# Patient Record
Sex: Female | Born: 2005 | Race: White | Hispanic: No | Marital: Single | Smoking: Never smoker
Health system: Southern US, Community
[De-identification: ages and names within clinical notes are randomized; demographics above are authoritative.]

## PROBLEM LIST (undated history)

## (undated) DIAGNOSIS — K200 Eosinophilic esophagitis: Secondary | ICD-10-CM

## (undated) DIAGNOSIS — K219 Gastro-esophageal reflux disease without esophagitis: Secondary | ICD-10-CM

## (undated) HISTORY — PX: TYMPANOSTOMY: SHX2586

## (undated) HISTORY — PX: HIATAL HERNIA REPAIR: SHX195

## (undated) HISTORY — DX: Gastro-esophageal reflux disease without esophagitis: K21.9

## (undated) HISTORY — PX: APPENDECTOMY: SHX54

## (undated) HISTORY — DX: Eosinophilic esophagitis: K20.0

---
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
## 2023-01-19 ENCOUNTER — Encounter: Payer: Self-pay | Admitting: Gastroenterology

## 2023-01-19 ENCOUNTER — Ambulatory Visit: Payer: Managed Care, Other (non HMO) | Admitting: Gastroenterology

## 2023-01-19 VITALS — BP 116/68 | HR 80 | Ht 65.0 in | Wt 115.2 lb

## 2023-01-19 DIAGNOSIS — K200 Eosinophilic esophagitis: Secondary | ICD-10-CM

## 2023-01-19 DIAGNOSIS — R1310 Dysphagia, unspecified: Secondary | ICD-10-CM

## 2023-01-19 DIAGNOSIS — R1319 Other dysphagia: Secondary | ICD-10-CM

## 2023-01-19 NOTE — Patient Instructions (Addendum)
   You have been scheduled for an endoscopy. Please follow written instructions given to you at your visit today.  If you use inhalers (even only as needed), please bring them with you on the day of your procedure.  If you take any of the following medications, they will need to be adjusted prior to your procedure:   DO NOT TAKE 7 DAYS PRIOR TO TEST- Trulicity (dulaglutide) Ozempic, Wegovy (semaglutide) Mounjaro (tirzepatide) Bydureon Bcise (exanatide extended release)  DO NOT TAKE 1 DAY PRIOR TO YOUR TEST Rybelsus (semaglutide) Adlyxin (lixisenatide) Victoza (liraglutide) Byetta (exanatide) ___________________________________________________________________________    _______________________________________________________  If your blood pressure at your visit was 140/90 or greater, please contact your primary care physician to follow up on this.  _______________________________________________________  If you are age 7 or older, your body mass index should be between 23-30. Your Body mass index is 19.17 kg/m. If this is out of the aforementioned range listed, please consider follow up with your Primary Care Provider.  If you are age 87 or younger, your body mass index should be between 19-25. Your Body mass index is 19.17 kg/m. If this is out of the aformentioned range listed, please consider follow up with your Primary Care Provider.   ________________________________________________________  The [facility] providers would like to encourage you to use [facility] to communicate with providers for non-urgent requests or questions.  Due to long hold times on the telephone, sending your provider a message by [facility] may be a faster and more efficient way to get a response.  Please allow 48 business hours for a response.  Please remember that this is for non-urgent requests.  _______________________________________________________ It was a pleasure to see you today!  Thank you for  trusting me with your gastrointestinal care!

## 2023-01-19 NOTE — Progress Notes (Signed)
[facility] Progress Note  Chief Complaint: Eosinophilic esophagitis with dysphagia  Subjective  Prior history  Caitlyn Wells is on break from college and following up today for eosinophilic esophagitis diagnosed at EGD 09/14/2022 and confirmed on biopsies.  She has been on budesonide slurry 2 mg nightly since early August, and was doing well at a video visit 11/03/2022.   Discussed the use of AI scribe software for clinical note transcription with the patient, who gave verbal consent to proceed.  History of Present Illness   The patient, with a history of eosinophilic esophagitis, reports a sensation of tightness in the throat, particularly when consuming certain foods. The specific triggers remain unidentified, as the patient has been unable to discern a pattern. Despite these symptoms, the patient's weight has remained stable over the past two months.  The patient has been inconsistently using budesonide liquid, averaging once a week, despite initial daily use for approximately one and a half months. The patient noticed the return of symptoms after this period of regular use. No new symptoms such as swelling, redness in the eyes or joints, or rashes have been reported.   ROS: Cardiovascular:  no chest pain Respiratory: no dyspnea  The patient's Past Medical, Family and Social History were reviewed and are on file in the EMR. Past Medical History:  Diagnosis Date   Eosinophilic esophagitis    GERD (gastroesophageal reflux disease)     Past Surgical History:  Procedure Laterality Date   APPENDECTOMY     HIATAL HERNIA REPAIR     KNEE ARTHROCENTESIS  08/2021   TYMPANOSTOMY       Objective:  Med list reviewed  Current Outpatient Medications:    budesonide (PULMICORT) 0.5 MG/2ML nebulizer solution, Take 8 mLs (2 mg total) by nebulization at bedtime., Disp: 240 mL, Rfl: 1   fluticasone (FLONASE) 50 MCG/ACT nasal spray, Place into the nose., Disp: , Rfl:    Vital signs in last  24 hrs: Vitals:   01/19/23 0907  BP: 116/68  Pulse: 80   Weight stable at 115 pounds  Physical Exam  Well-appearing, petite and thin Cardiac: Regular without appreciable murmur,  no peripheral edema Pulm: clear to auscultation bilaterally, normal RR and effort noted Abdomen: soft, no tenderness, with active bowel sounds. No guarding or palpable hepatosplenomegaly. Skin; warm and dry, no jaundice or rash   Labs:   ___________________________________________ Radiologic studies:   ____________________________________________ Other: 09/14/2022 upper endoscopic biopsies  1. Surgical [P], duodenal bx BENIGN DUODENAL MUCOSA WITH NO DIAGNOSTIC ABNORMALITY 2. Surgical [P], distal esophagus EOSINOPHIL RICH ESOPHAGITIS CONSISTENT WITH EOSINOPHILIC ESOPHAGITIS (>50 EOS/HIGH POWER FIELD) NEGATIVE FOR GLANDULAR EPITHELIUM, DYSPLASIA AND CARCINOMA 3. Surgical [P], mid esophagus EOSINOPHIL-RICH ESOPHAGITIS CONSISTENT WITH EOSINOPHILIC ESOPHAGITIS (> 30 EOS/HIGH POWER FIELD) NEGATIVE FOR GLANDULAR EPITHELIUM, DYSPLASIA AND CARCINOMA _____________________________________________   Encounter Diagnoses  Name Primary?   Eosinophilic esophagitis Yes   Esophageal dysphagia    Persistent dysphagia, not surprising given the degree of initial stricture and modest dilation that was initially performed.  She needs repeat evaluation for further esophageal stricture dilation and biopsies to assess response to treatment.  Challenging is always in this condition to determine specific food triggers since they are not IgE mediated, therefore not allowing IgE antibody based food testing. Additional clinical information regarding this condition as potential triggers were sent to the patient in prior portal messages. Assessment and Plan    Eosinophilic Esophagitis Persistent symptoms of dysphagia and sensation of tightness in the throat. Inconsistent use of Budesonide  liquid (once weekly). Stable  weight. No new symptoms of swelling, redness, or rashes. Discussed the dual nature of the condition involving mechanical narrowing and motility issues due to chronic inflammation and fibrosis. Also discussed potential future treatment with Dupixent if insufficient improvement with Budesonide. -Encouraged consistent use of Budesonide liquid daily until next endoscopy. -Plan for repeat endoscopy with dilation and biopsies to assess the degree of improvement with Budesonide treatment.    20 minutes were spent on this encounter (including chart review, history/exam, counseling/coordination of care, and documentation) > 50% of that time was spent on counseling and coordination of care.   Charlie Pitter III

## 2023-03-01 ENCOUNTER — Ambulatory Visit: Payer: Managed Care, Other (non HMO) | Admitting: Gastroenterology

## 2023-03-01 ENCOUNTER — Encounter: Payer: Self-pay | Admitting: Gastroenterology

## 2023-03-01 VITALS — BP 110/79 | HR 93 | Temp 98.4°F | Resp 12 | Ht 65.0 in | Wt 115.0 lb

## 2023-03-01 DIAGNOSIS — K200 Eosinophilic esophagitis: Secondary | ICD-10-CM

## 2023-03-01 DIAGNOSIS — R1310 Dysphagia, unspecified: Secondary | ICD-10-CM | POA: Diagnosis not present

## 2023-03-01 DIAGNOSIS — R1319 Other dysphagia: Secondary | ICD-10-CM

## 2023-03-01 DIAGNOSIS — K222 Esophageal obstruction: Secondary | ICD-10-CM | POA: Diagnosis present

## 2023-03-01 DIAGNOSIS — R110 Nausea: Secondary | ICD-10-CM

## 2023-03-01 MED ORDER — SODIUM CHLORIDE 0.9 % IV SOLN: 500.0000 mL | Freq: Once | INTRAVENOUS | Status: AC

## 2023-03-01 MED ORDER — SODIUM CHLORIDE 0.9 % IV SOLN
4.0000 mg | Freq: Once | INTRAVENOUS | Status: AC
  Administered 2023-03-01: 4 mg via INTRAVENOUS

## 2023-03-01 NOTE — Progress Notes (Signed)
 History and Physical:  This patient presents for endoscopic testing for: Encounter Diagnoses  Name Primary?   Eosinophilic esophagitis Yes   Esophageal dysphagia    Esophageal stricture     EGD today for evaluation of EoE and dilation of esophageal stricture for dysphagia.   Clinical details in 01/19/23 office note and no significant changes reported since then.  Patient is otherwise without complaints or active issues today.   Past Medical History: Past Medical History:  Diagnosis Date   Eosinophilic esophagitis    GERD (gastroesophageal reflux disease)      Past Surgical History: Past Surgical History:  Procedure Laterality Date   APPENDECTOMY     HIATAL HERNIA REPAIR     KNEE ARTHROCENTESIS  08/2021   TYMPANOSTOMY      Allergies: Not on File  Outpatient Meds: Current Outpatient Medications  Medication Sig Dispense Refill   fluticasone (FLONASE) 50 MCG/ACT nasal spray Place into the nose.     budesonide  (PULMICORT ) 0.5 MG/2ML nebulizer solution Take 8 mLs (2 mg total) by nebulization at bedtime. 240 mL 1   HYDROcodone-acetaminophen (NORCO) 7.5-325 MG tablet TAKE 1 TABLET BY MOUTH EVERY 6 TO 8 HOURS AS NEEDED     Current Facility-Administered Medications  Medication Dose Route Frequency Provider Last Rate Last Admin   0.9 %  sodium chloride  infusion  500 mL Intravenous Once Danis, Victory LITTIE MOULD, MD          ___________________________________________________________________ Objective   Exam:  BP 117/75   Pulse (!) 107   Temp 98.4 F (36.9 C) (Temporal)   Ht 5' 5 (1.651 m)   Wt 115 lb (52.2 kg)   SpO2 100%   BMI 19.14 kg/m   CV: regular , S1/S2 Resp: clear to auscultation bilaterally, normal RR and effort noted GI: soft, no tenderness, with active bowel sounds.   Assessment: Encounter Diagnoses  Name Primary?   Eosinophilic esophagitis Yes   Esophageal dysphagia    Esophageal stricture      Plan: EGD with biopsies and dilation.  The  benefits and risks of the planned procedure were described in detail with the patient or (when appropriate) their health care proxy.  Risks were outlined as including, but not limited to, bleeding, infection, perforation, adverse medication reaction leading to cardiac or pulmonary decompensation, pancreatitis (if ERCP).  The limitation of incomplete mucosal visualization was also discussed.  No guarantees or warranties were given.  The patient is appropriate for an endoscopic procedure in the ambulatory setting.   - Victory Brand, MD

## 2023-03-01 NOTE — Progress Notes (Signed)
To pacu, vss. Report to [email or web address] 

## 2023-03-01 NOTE — Op Note (Signed)
 [facility] Patient Name: Caitlyn Wells Procedure Date: 03/01/2023 9:00 AM MRN: [id number] Endoscopist: Victory L. Legrand , MD, [id number] Age: 18 Referring MD:  Date of Birth: 2005/07/27 Gender: Female Account #: [id number] Procedure:                Upper GI endoscopy Indications:              Esophageal dysphagia, For therapy of esophageal                            stricture, Follow-up of eosinophilic esophagitis                           patient taking oral budesonide  (inconsistently)                           clinical details in 01/19/23 office note Medicines:                Monitored Anesthesia Care Procedure:                Pre-Anesthesia Assessment:                           - Prior to the procedure, a History and Physical                            was performed, and patient medications and                            allergies were reviewed. The patient's tolerance of                            previous anesthesia was also reviewed. The risks                            and benefits of the procedure and the sedation                            options and risks were discussed with the patient.                            All questions were answered, and informed consent                            was obtained. Prior Anticoagulants: The patient has                            taken no anticoagulant or antiplatelet agents. ASA                            Grade Assessment: II - A patient with mild systemic                            disease. After reviewing the risks and benefits,  the patient was deemed in satisfactory condition to                            undergo the procedure.                           After obtaining informed consent, the endoscope was                            passed under direct vision. Throughout the                            procedure, the patient's blood pressure, pulse, and                            oxygen saturations  were monitored continuously. The                            Olympus Scope [id number] was introduced through the                            mouth, and advanced to the second part of duodenum.                            The upper GI endoscopy was accomplished without                            difficulty. The patient tolerated the procedure                            well. Scope In: Scope Out: Findings:                 The larynx was normal.                           Mucosal changes including longitudinal furrows,                            small-caliber esophagus, white plaques, congestion                            (edema) and punctate white spots were found at the                            esophageal anastomosis. Esophageal findings were                            graded using the Eosinophilic Esophagitis                            Endoscopic Reference Score (EoE-EREFS) as: Edema                            Grade 1 Present (decreased clarity or absence of  vascular markings), Rings Grade 1 Mild (subtle                            circumferential ridges seen on esophageal                            distension), Exudates Grade 2 Severe (scattered                            white lesions involving 10 percent or greater of                            the esophageal surface area), Furrows Grade 1 Mild                            (vertical lines without visible depth) and                            Stricture present. Several biopsies were obtained                            in the upper third of the esophagus and in the                            lower third of the esophagus with cold forceps for                            evaluation of eosinophilic esophagitis. (positive                            tug sign) Mucosal appearance is not improved                            since last EGD July 2024                           One benign-appearing, intrinsic moderate stenosis                             was found at the gastroesophageal junction. This                            stenosis measured 8 mm (inner diameter) x less than                            one cm (in length). The stenosis was traversed. A                            TTS dilator was passed through the scope. Dilation                            with an 01-05-12 mm balloon dilator was performed  to 13 mm. The dilation site was examined and showed                            moderate mucosal disruption and moderate                            improvement in luminal narrowing.                           The stomach was normal.                           The cardia and gastric fundus were normal on                            retroflexion.                           The examined duodenum was normal. Complications:            No immediate complications. Estimated Blood Loss:     Estimated blood loss was minimal. Impression:               - Normal larynx.                           - Esophageal mucosal changes secondary to                            eosinophilic esophagitis.                           - Benign-appearing esophageal stenosis. Dilated.                           - Normal stomach.                           - Normal examined duodenum.                           - Several biopsies were obtained in the upper third                            of the esophagus and in the lower third of the                            esophagus. Recommendation:           - Patient has a contact number available for                            emergencies. The signs and symptoms of potential                            delayed complications were discussed with the  patient. Return to normal activities tomorrow.                            Written discharge instructions were provided to the                            patient.                           - Resume previous diet.                            - Continue present medications.                           - Await pathology results (evaluate eosinophil                            count c/w prior exam and r/o candida). Then discuss                            starting Dupixent .                           - Return to my office at appointment to be                            scheduled. Marina Desire L. Legrand, MD 03/01/2023 10:01:08 AM This report has been signed electronically.

## 2023-03-01 NOTE — Patient Instructions (Signed)
 Return to normal activities tomorrow. Written discharge instructions were provided to the patient. - Resume previous diet. - Continue present medications. - Await pathology results ( evaluate eosinophil count c/ w prior exam and r/ o candida) . Then discuss starting Dupixent . - Return to my office at appointment to be scheduled.  YOU HAD AN ENDOSCOPIC PROCEDURE TODAY AT THE [facility]:   Refer to the procedure report that was given to you for any specific questions about what was found during the examination.  If the procedure report does not answer your questions, please call your gastroenterologist to clarify.  If you requested that your care partner not be given the details of your procedure findings, then the procedure report has been included in a sealed envelope for you to review at your convenience later.  YOU SHOULD EXPECT: Some feelings of bloating in the abdomen. Passage of more gas than usual.  Walking can help get rid of the air that was put into your GI tract during the procedure and reduce the bloating. If you had a lower endoscopy (such as a colonoscopy or flexible sigmoidoscopy) you may notice spotting of blood in your stool or on the toilet paper. If you underwent a bowel prep for your procedure, you may not have a normal bowel movement for a few days.  Please Note:  You might notice some irritation and congestion in your nose or some drainage.  This is from the oxygen used during your procedure.  There is no need for concern and it should clear up in a day or so.  SYMPTOMS TO REPORT IMMEDIATELY:  Following lower endoscopy (colonoscopy or flexible sigmoidoscopy):  Excessive amounts of blood in the stool  Significant tenderness or worsening of abdominal pains  Swelling of the abdomen that is new, acute  Fever of 100F or higher  Following upper endoscopy (EGD)  Vomiting of blood or coffee ground material  New chest pain or pain under the shoulder blades  Painful or  persistently difficult swallowing  New shortness of breath  Fever of 100F or higher  Black, tarry-looking stools  For urgent or emergent issues, a gastroenterologist can be reached at any hour by calling [phone]. Do not use MyChart messaging for urgent concerns.    DIET:  We do recommend a small meal at first, but then you may proceed to your regular diet.  Drink plenty of fluids but you should avoid alcoholic beverages for 24 hours.  ACTIVITY:  You should plan to take it easy for the rest of today and you should NOT DRIVE or use heavy machinery until tomorrow (because of the sedation medicines used during the test).    FOLLOW UP: Our staff will call the number listed on your records the next business day following your procedure.  We will call around 7:15- 8:00 am to check on you and address any questions or concerns that you may have regarding the information given to you following your procedure. If we do not reach you, we will leave a message.     If any biopsies were taken you will be contacted by phone or by letter within the next 1-3 weeks.  Please call us  at [phone] if you have not heard about the biopsies in 3 weeks.    SIGNATURES/CONFIDENTIALITY: You and/or your care partner have signed paperwork which will be entered into your electronic medical record.  These signatures attest to the fact that that the information above on your After Visit Summary  has been reviewed and is understood.  Full responsibility of the confidentiality of this discharge information lies with you and/or your care-partner.

## 2023-03-01 NOTE — Progress Notes (Signed)
 Called to room to assist during endoscopic procedure.  Patient ID and intended procedure confirmed with present staff. Received instructions for my participation in the procedure from the performing physician.

## 2023-03-02 ENCOUNTER — Telehealth: Payer: Self-pay

## 2023-03-02 NOTE — Telephone Encounter (Signed)
 Left message on follow up call.

## 2023-03-03 LAB — SURGICAL PATHOLOGY

## 2023-03-09 ENCOUNTER — Encounter: Payer: Self-pay | Admitting: Gastroenterology

## 2023-03-09 DIAGNOSIS — K222 Esophageal obstruction: Secondary | ICD-10-CM

## 2023-03-09 DIAGNOSIS — K200 Eosinophilic esophagitis: Secondary | ICD-10-CM

## 2023-03-09 DIAGNOSIS — R1319 Other dysphagia: Secondary | ICD-10-CM

## 2023-03-10 NOTE — Telephone Encounter (Signed)
 Telemedicine visits are difficult to work into my in-person clinic blocks. I could do it at 4pm on Jan 23rd. (If I am running late in clinic, we will call her)  - H. Danis

## 2023-03-18 ENCOUNTER — Telehealth (INDEPENDENT_AMBULATORY_CARE_PROVIDER_SITE_OTHER): Payer: Managed Care, Other (non HMO) | Admitting: Gastroenterology

## 2023-03-18 DIAGNOSIS — K222 Esophageal obstruction: Secondary | ICD-10-CM

## 2023-03-18 DIAGNOSIS — R1319 Other dysphagia: Secondary | ICD-10-CM | POA: Diagnosis not present

## 2023-03-18 DIAGNOSIS — K200 Eosinophilic esophagitis: Secondary | ICD-10-CM | POA: Diagnosis not present

## 2023-03-18 NOTE — Telephone Encounter (Signed)
Dr. Myrtie Neither, please advise on script for Dupixent. Thanks

## 2023-03-18 NOTE — Progress Notes (Signed)
Telemedicine Visit:  Participants on the conference : myself and patient   The patient consented to this consultation and was aware that a charge will be placed through their insurance.  They were also made aware of the limitations of telemedicine.  I was in my office and the patient was at home.   Encounter time:  Total time 20 minutes, with 15 minutes spent with patient on [facility] (audio and video)   _____________________________________________________________________________________________            [facility] GI Progress Note  Chief Complaint: Dysphagia and eosinophilic esophagitis  Subjective  History: EOE and stricture history outlined in prior notes. Was taking budesonide slurry regularly after first EGD, then inconsistently last couple of months.  EGD 03/01/2023 with similar appearance to July 2024.  EG junction stricture not quite as tight, and it could be passed with moderate scope pressure.  Dilated up to 13 mm, biopsies taken (results still showed significant eosinophilic infiltrate)  Caitlyn Wells reports that her swallowing is somewhat better after the recent dilation, that she needs to be careful eating as always. She is familiar with the Dupixent since a sibling of hers takes it for eczema, and she is aware that it is a weekly injection.  She does express some concerns about its potential cost and is planning to have a further discussion with her parents later this evening about whether they may want to move forward with insurance authorization and cost analysis.  She is generally been able to maintain her weight albeit with a modified consistency diet  ROS: Cardiovascular:  no chest pain Respiratory: no dyspnea  The patient's Past Medical, Family and Social History were reviewed and are on file in the EMR. Past Medical History:  Diagnosis Date   Eosinophilic esophagitis    GERD (gastroesophageal reflux disease)     Past Surgical History:  Procedure Laterality  Date   APPENDECTOMY     HIATAL HERNIA REPAIR     KNEE ARTHROCENTESIS  08/2021   TYMPANOSTOMY      Objective:  Med list reviewed  Current Outpatient Medications:    budesonide (PULMICORT) 0.5 MG/2ML nebulizer solution, Take 8 mLs (2 mg total) by nebulization at bedtime., Disp: 240 mL, Rfl: 1   fluticasone (FLONASE) 50 MCG/ACT nasal spray, Place into the nose., Disp: , Rfl:    HYDROcodone-acetaminophen (NORCO) 7.5-325 MG tablet, TAKE 1 TABLET BY MOUTH EVERY 6 TO 8 HOURS AS NEEDED, Disp: , Rfl:   Current Facility-Administered Medications:    0.9 %  sodium chloride infusion, 500 mL, Intravenous, Once, Caitlyn Wells, Caitlyn Blower, MD      Physical Exam  No exam-telemedicine visit  Biopsy results: FINAL DIAGNOSIS       1. Surgical [P], distal esophagus :      EOSINOPHIL RICH ESOPHAGITIS       2. Surgical [P], proximal esophagus :      EOSINOPHIL RICH ESOPHAGITIS       ELECTRONIC SIGNATURE : Caitlyn Wells , Sports administrator, International aid/development worker  MICROSCOPIC DESCRIPTION 1. -2.These esophageal biopsies show squamous mucosa with marked reactive changes including elongation of papillae and marked basal zone hyperplasia with a marked increased and mixed mononuclear cell infiltrate, predominantly eosinophils.These eosinophils exhibit significant degranulation and a superficial margination with scattered eosinophilic micro abscesses.These eosinophils number greater than 30 within a high-power field.No glandular epithelium is present.These histologic changes are consistent with an eosinophil rich esophagitis the differential diagnosis of which would include true eosinophilic esophagitis (favored) and severe reflux esophagitis.Clinical and  endoscopic correlation is recommended.    _____________________________________________ Assessment & Plan  Assessment: Encounter Diagnoses  Name Primary?   Eosinophilic esophagitis Yes   Esophageal dysphagia    Esophageal stricture    EOE,  little or no improvement on budesonide slurry We discussed the progressive nature of this condition if we cannot control the eosinophilic inflammation.  That means it would likely become increasingly difficult for endoscopic dilation to give lasting result because of the probable development of fibrosis within the esophageal wall and resultant dysmotility.  She has tried her best to avoid some of the most common suspected dietary triggers for this condition.  I recommended that they consider starting Dupixent.  We discussed the nature that medicine, potential risks of parasitic infection, respiratory infections, joint pain, neurologic symptoms, local injection site reaction.   Plan: She will have a further discussion with her parents and let us know soon about how they would like to proceed.  If they wish to move forward with insurance authorization and cost analysis, the dose would be  Dupixent 300 mg subcutaneous injection once a week   Caitlyn Wells

## 2023-03-22 ENCOUNTER — Telehealth: Payer: Self-pay

## 2023-03-22 MED ORDER — DUPILUMAB 300 MG/2ML ~~LOC~~ SOSY: 300.0000 mg | PREFILLED_SYRINGE | SUBCUTANEOUS | 5 refills | Status: DC

## 2023-03-22 NOTE — Telephone Encounter (Signed)
RX sent to pharmacy on file. See 1/27 telephone encounter for details regarding PA for Dupixent.

## 2023-03-22 NOTE — Telephone Encounter (Signed)
Please initiate PA for Dupixent 300 mg subcutaneous injection weekly. RX has been sent to local pharmacy. Thanks

## 2023-03-22 NOTE — Telephone Encounter (Signed)
Thank you for the update, and please see my January 23 telemedicine note when you can.  Dupixent 300 mg subcutaneous once a week  Ellwood Dense MD

## 2023-03-24 ENCOUNTER — Telehealth: Payer: Self-pay

## 2023-03-24 ENCOUNTER — Other Ambulatory Visit (HOSPITAL_COMMUNITY): Payer: Self-pay

## 2023-03-24 NOTE — Telephone Encounter (Signed)
Pharmacy Patient Advocate Encounter   Received notification from CoverMyMeds that prior authorization for Dupixent 300 mg/2 ml syringes is required/requested.   Insurance verification completed.   The patient is insured through [employer] .   Per test claim: PA required; PA submitted to above mentioned insurance via CoverMyMeds Key/confirmation #/EOC [id number] Status is pending

## 2023-03-24 NOTE — Telephone Encounter (Signed)
Pharmacy Patient Advocate Encounter  Received notification from [employer] that Prior Authorization for DUPIXENT 300MG  has been DENIED.  Full denial letter will be uploaded to the media tab. See denial reason below.   PA #/Case ID/Reference #: [id number]  Pt does not have secondary cause for EOE. Will submit appeal.

## 2023-03-24 NOTE — Telephone Encounter (Signed)
PA request has been Submitted. New Encounter created for follow up. For additional info see Pharmacy Prior Auth telephone encounter from 03/24/2023.

## 2023-03-25 ENCOUNTER — Telehealth: Payer: Self-pay | Admitting: Pharmacist

## 2023-03-25 NOTE — Telephone Encounter (Signed)
See additional prior authorization phone note dated 03/24/23.

## 2023-03-25 NOTE — Telephone Encounter (Signed)
An expedited appeal has been submitted for Dupixent. Will advise when response is received. Appeal letter and supporting documentation have been faxed to [phone] on 03/25/2023 @10 :35 am.  Documents were marked expedited.  Thank you, Dellie Burns, PharmD Clinical Pharmacist  [place]  Direct Dial: [phone]

## 2023-04-01 NOTE — Telephone Encounter (Signed)
 [place], just wanted to follow up on appeal. Thanks

## 2023-04-02 ENCOUNTER — Other Ambulatory Visit (HOSPITAL_COMMUNITY): Payer: Self-pay

## 2023-04-02 DIAGNOSIS — K200 Eosinophilic esophagitis: Secondary | ICD-10-CM

## 2023-04-02 DIAGNOSIS — R1319 Other dysphagia: Secondary | ICD-10-CM

## 2023-04-02 DIAGNOSIS — K222 Esophageal obstruction: Secondary | ICD-10-CM

## 2023-04-02 MED ORDER — DUPILUMAB 300 MG/2ML ~~LOC~~ SOSY: 300.0000 mg | PREFILLED_SYRINGE | SUBCUTANEOUS | 5 refills | Status: AC

## 2023-04-02 NOTE — Telephone Encounter (Signed)
Thank you! Pt notified via MyChart

## 2023-04-02 NOTE — Telephone Encounter (Signed)
 Notified by [employer] the appeal for Dupixent  has bee approved from 03/25/2023 through 08/23/2023.  Reference #[id number]
